# Patient Record
Sex: Male | Born: 2000 | Race: Black or African American | Hispanic: No | Marital: Single | Smoking: Never smoker
Health system: Southern US, Community
[De-identification: ages and names within clinical notes are randomized; demographics above are authoritative.]

## PROBLEM LIST (undated history)

## (undated) DIAGNOSIS — H548 Legal blindness, as defined in USA: Secondary | ICD-10-CM

## (undated) HISTORY — PX: EYE SURGERY: SHX253

---
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
## 2019-02-07 ENCOUNTER — Encounter (HOSPITAL_COMMUNITY): Payer: Self-pay

## 2019-02-07 ENCOUNTER — Ambulatory Visit (HOSPITAL_COMMUNITY)
Admission: EM | Admit: 2019-02-07 | Discharge: 2019-02-07 | Disposition: A | Discharge status: Home / Self Care | Payer: Medicaid Other | Attending: Family Medicine | Admitting: Family Medicine

## 2019-02-07 ENCOUNTER — Ambulatory Visit (INDEPENDENT_AMBULATORY_CARE_PROVIDER_SITE_OTHER): Payer: Medicaid Other

## 2019-02-07 ENCOUNTER — Other Ambulatory Visit: Payer: Self-pay

## 2019-02-07 DIAGNOSIS — M79674 Pain in right toe(s): Secondary | ICD-10-CM | POA: Diagnosis not present

## 2019-02-07 DIAGNOSIS — M79642 Pain in left hand: Secondary | ICD-10-CM

## 2019-02-07 DIAGNOSIS — M545 Low back pain: Secondary | ICD-10-CM

## 2019-02-07 DIAGNOSIS — W2211XA Striking against or struck by driver side automobile airbag, initial encounter: Secondary | ICD-10-CM

## 2019-02-07 MED ORDER — NAPROXEN 500 MG PO TABS: 500.0000 mg | ORAL_TABLET | Freq: Two times a day (BID) | ORAL | 0 refills | Status: DC

## 2019-02-07 MED ORDER — CYCLOBENZAPRINE HCL 10 MG PO TABS: 5.0000 mg | ORAL_TABLET | Freq: Every day | ORAL | 0 refills | Status: DC
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
## 2019-06-08 ENCOUNTER — Encounter (HOSPITAL_COMMUNITY): Payer: Self-pay

## 2019-06-08 ENCOUNTER — Ambulatory Visit (HOSPITAL_COMMUNITY)
Admission: EM | Admit: 2019-06-08 | Discharge: 2019-06-08 | Disposition: A | Discharge status: Home / Self Care | Payer: Medicaid Other | Attending: Family Medicine | Admitting: Family Medicine

## 2019-06-08 ENCOUNTER — Other Ambulatory Visit: Payer: Self-pay

## 2019-06-08 DIAGNOSIS — Z113 Encounter for screening for infections with a predominantly sexual mode of transmission: Secondary | ICD-10-CM | POA: Diagnosis present

## 2019-06-08 DIAGNOSIS — M545 Low back pain, unspecified: Secondary | ICD-10-CM

## 2019-06-08 MED ORDER — DICLOFENAC SODIUM 75 MG PO TBEC: 75.0000 mg | DELAYED_RELEASE_TABLET | Freq: Two times a day (BID) | ORAL | 0 refills | Status: DC

## 2019-06-08 NOTE — ED Notes (Signed)
At the completion of triage, patient requested to this RN that he also receive STD testing. Pt refuses HIV testing.

## 2019-06-08 NOTE — Discharge Instructions (Addendum)
We have sent testing for sexually transmitted infections. We will notify you of any positive results once they are received. If required, we will prescribe any medications you might need.  Please refrain from all sexual activity for at least the next seven days.  

## 2019-06-08 NOTE — ED Triage Notes (Signed)
Patient presents to Urgent Care with complaints of rectal pain since this morning. Patient reports no known injury, denies constipation or difficulty passing BMs. Pt appears uncomfortable discussing the matter with this RN.

## 2019-06-10 NOTE — ED Provider Notes (Signed)
[facility]   [id number] 06/08/19 Arrival Time: 1756  ASSESSMENT & PLAN:  1. Acute right-sided low back pain without sciatica   2. Screening for STDs (sexually transmitted diseases)     Able to ambulate here and hemodynamically stable. No sign of skin infection. No indication for imaging of back at this time given no trauma and normal neurological exam. Discussed.  Meds ordered this encounter  Medications  . diclofenac (VOLTAREN) 75 MG EC tablet    Sig: Take 1 tablet (75 mg total) by mouth 2 (two) times daily.    Dispense:  14 tablet    Refill:  0   Encourage ROM/movement as tolerated.     Discharge Instructions     We have sent testing for sexually transmitted infections. We will notify you of any positive results once they are received. If required, we will prescribe any medications you might need.  Please refrain from all sexual activity for at least the next seven days.      Reviewed expectations re: course of current medical issues. Questions answered. Outlined signs and symptoms indicating need for more acute intervention. Patient verbalized understanding. After Visit Summary given.   SUBJECTIVE: History from: patient.  Mark Taylor is a 18 y.o. male who presents with complaint of intermittent bilateral (R>L) lower back discomfort. Onset abrupt, first noticed this morning. Injury/trama: no. History of back problems: no. Discomfort described as soreness without radiation. No specific rectal pain reported even though he mentioned this during triage. "Kinf of at the top of my butt". Pain is unchanged with prolonged walking/standing, unchanged with movements involving back, and unchanged with rest. Progressive LE weakness or saddle anesthesia: none. Extremity sensation changes or weakness: none. Ambulatory without difficulty. Normal bowel/bladder habits: yes; without urinary retention. No associated abdominal pain/n/v. Self treatment: has tried nothing  for pain relief.  Reports no chronic steroid use, fevers, IV drug use, or recent back surgeries or procedures.  Later in visit requests STI screening. No penile discharge or skin lesions. "Just wanted to get checked". Sexually active with one male partner; without regular condom use.  ROS: As per HPI. All other systems negative.   OBJECTIVE:  Vitals:   06/08/19 1804  BP: 115/73  Pulse: 69  Resp: 14  Temp: 98.8 F (37.1 C)  TempSrc: Oral  SpO2: 100%    General appearance: alert; no distress Neck: supple with FROM; without midline tenderness CV: RRR Lungs: unlabored respirations; symmetrical air entry Abdomen: soft, non-tender; non-distended Back: mild tenderness that is poorly localized reported at lower back and upper midline buttock; no sign of pilonidal cyst; overlying skin is normal; FROM at waist; bruising: none; without lumbar midline tenderness GU/Rectal: declines Extremities: no edema; symmetrical with no gross deformities; normal ROM of bilateral lower extremities Skin: warm and dry Neurologic: normal gait; normal reflexes of RLE and LLE; normal sensation of RLE and LLE; normal strength of RLE and LLE Psychological: alert and cooperative; normal mood and affect  Labs: No results found for this or any previous visit. Labs Reviewed  CYTOLOGY, (ORAL, ANAL, URETHRAL) ANCILLARY ONLY     No Known Allergies  PMH: "Healthy."  Social History   Socioeconomic History  . Marital status: Single    Spouse name: Not on file  . Number of children: Not on file  . Years of education: Not on file  . Highest education level: Not on file  Occupational History  . Not on file  Social Needs  . Financial resource strain: Not  on file  . Food insecurity    Worry: Not on file    Inability: Not on file  . Transportation needs    Medical: Not on file    Non-medical: Not on file  Tobacco Use  . Smoking status: Never Smoker  . Smokeless tobacco: Never Used  Substance and  Sexual Activity  . Alcohol use: Not Currently  . Drug use: Yes    Types: Marijuana    Comment: socially  . Sexual activity: Not on file  Lifestyle  . Physical activity    Days per week: Not on file    Minutes per session: Not on file  . Stress: Not on file  Relationships  . Social Herbalist on phone: Not on file    Gets together: Not on file    Attends religious service: Not on file    Active member of club or organization: Not on file    Attends meetings of clubs or organizations: Not on file    Relationship status: Not on file  . Intimate partner violence    Fear of current or ex partner: Not on file    Emotionally abused: Not on file    Physically abused: Not on file    Forced sexual activity: Not on file  Other Topics Concern  . Not on file  Social History Narrative  . Not on file   Family History  Problem Relation Age of Onset  . Healthy Mother   . Healthy Father    History reviewed. No pertinent surgical history.   Vanessa Kick, MD 06/10/19 [phone]

## 2019-06-11 LAB — CYTOLOGY, (ORAL, ANAL, URETHRAL) ANCILLARY ONLY
Chlamydia: NEGATIVE
Neisseria Gonorrhea: NEGATIVE

## 2019-09-02 ENCOUNTER — Encounter (HOSPITAL_COMMUNITY): Payer: Self-pay

## 2019-09-02 ENCOUNTER — Other Ambulatory Visit: Payer: Self-pay

## 2019-09-02 ENCOUNTER — Ambulatory Visit (HOSPITAL_COMMUNITY)
Admission: EM | Admit: 2019-09-02 | Discharge: 2019-09-02 | Disposition: A | Discharge status: Home / Self Care | Payer: Medicaid Other | Attending: Family Medicine | Admitting: Family Medicine

## 2019-09-02 DIAGNOSIS — S39012A Strain of muscle, fascia and tendon of lower back, initial encounter: Secondary | ICD-10-CM | POA: Diagnosis not present

## 2019-09-02 DIAGNOSIS — S161XXA Strain of muscle, fascia and tendon at neck level, initial encounter: Secondary | ICD-10-CM | POA: Diagnosis not present

## 2019-09-02 MED ORDER — CYCLOBENZAPRINE HCL 10 MG PO TABS: ORAL_TABLET | ORAL | 0 refills | Status: DC

## 2019-09-02 MED ORDER — DICLOFENAC SODIUM 75 MG PO TBEC: 75.0000 mg | DELAYED_RELEASE_TABLET | Freq: Two times a day (BID) | ORAL | 0 refills | Status: DC
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
## 2019-09-13 ENCOUNTER — Encounter (HOSPITAL_COMMUNITY): Payer: Self-pay

## 2019-09-13 ENCOUNTER — Ambulatory Visit (HOSPITAL_COMMUNITY)
Admission: EM | Admit: 2019-09-13 | Discharge: 2019-09-13 | Disposition: A | Discharge status: Home / Self Care | Payer: Medicaid Other | Attending: Family Medicine | Admitting: Family Medicine

## 2019-09-13 ENCOUNTER — Ambulatory Visit (INDEPENDENT_AMBULATORY_CARE_PROVIDER_SITE_OTHER): Payer: Medicaid Other

## 2019-09-13 ENCOUNTER — Other Ambulatory Visit: Payer: Self-pay

## 2019-09-13 DIAGNOSIS — S46911A Strain of unspecified muscle, fascia and tendon at shoulder and upper arm level, right arm, initial encounter: Secondary | ICD-10-CM | POA: Diagnosis not present

## 2019-09-13 MED ORDER — PREDNISONE 50 MG PO TABS: ORAL_TABLET | ORAL | 0 refills | Status: DC

## 2019-09-13 NOTE — Discharge Instructions (Signed)
EXAM: RIGHT SHOULDER - 2+ VIEW   COMPARISON:  None.   FINDINGS: The joint spaces are maintained. No acute bony findings or bone lesion. No abnormal soft tissue calcifications. The visualized lung is clear and the visualized ribs are intact.   IMPRESSION: No fracture or dislocation

## 2019-09-13 NOTE — ED Triage Notes (Signed)
Pt. States he is having right arm pain & states he may have slept on it wrong.

## 2019-09-13 NOTE — ED Provider Notes (Signed)
[facility]    CSN: [id number] Arrival date & time: 09/13/19  4270      History   Chief Complaint Chief Complaint  Patient presents with  . Arm Pain    HPI Mark Taylor is a 18 y.o. male.   Established [facility] patient with arm pain.  Pt. States he is having right arm pain & states he may have slept on it wrong.   He feels anterior soreness and popping internally with movement.  Able to raise arm over head, though with difficulty.  No neck pain.  No fever.  No h/o trauma.     History reviewed. No pertinent past medical history.  There are no active problems to display for this patient.   History reviewed. No pertinent surgical history.     Home Medications    Prior to Admission medications   Medication Sig Start Date End Date Taking? Authorizing Provider  cyclobenzaprine (FLEXERIL) 10 MG tablet Take 1 tablet by mouth 3 times daily as needed for muscle spasm. Warning: May cause drowsiness. 09/02/19   Mardella Layman, MD  diclofenac (VOLTAREN) 75 MG EC tablet Take 1 tablet (75 mg total) by mouth 2 (two) times daily. 09/02/19   Mardella Layman, MD  predniSONE (DELTASONE) 50 MG tablet One daily with food 09/13/19   Elvina Sidle, MD    Family History Family History  Problem Relation Age of Onset  . Healthy Mother   . Healthy Father     Social History Social History   Tobacco Use  . Smoking status: Never Smoker  . Smokeless tobacco: Never Used  Substance Use Topics  . Alcohol use: Not Currently  . Drug use: Yes    Types: Marijuana    Comment: socially     Allergies   Patient has no known allergies.   Review of Systems Review of Systems  All other systems reviewed and are negative.    Physical Exam Triage Vital Signs ED Triage Vitals [09/13/19 1001]  Enc Vitals Group     BP      Pulse      Resp      Temp      Temp src      SpO2      Weight 150 lb (68 kg)     Height      Head Circumference      Peak Flow      Pain Score 9     Pain Loc      Pain Edu?      Excl. in GC?    No data found.  Updated Vital Signs BP 113/80 (BP Location: Left Arm)   Pulse 68   Temp 98.4 F (36.9 C) (Oral)   Resp 17   Wt 68 kg   SpO2 97%    Physical Exam Vitals signs and nursing note reviewed.  Constitutional:      Appearance: Normal appearance. He is normal weight.  HENT:     Head: Normocephalic.     Nose: Nose normal.     Mouth/Throat:     Pharynx: Oropharynx is clear.  Eyes:     Conjunctiva/sclera: Conjunctivae normal.  Neck:     Musculoskeletal: Normal range of motion and neck supple.  Cardiovascular:     Rate and Rhythm: Normal rate.  Pulmonary:     Effort: Pulmonary effort is normal.     Breath sounds: Normal breath sounds.  Musculoskeletal: Normal range of motion.  General: Tenderness present. No swelling, deformity or signs of injury.     Comments: Able to slowly abduct right shoulder with some discomfort.  Mild anterior joint line tenderness.  Skin:    General: Skin is warm.  Neurological:     General: No focal deficit present.     Mental Status: He is alert.  Psychiatric:        Mood and Affect: Mood normal.      UC Treatments / Results  Labs (all labs ordered are listed, but only abnormal results are displayed) Labs Reviewed - No data to display  EKG   Radiology Dg Shoulder Right  Result Date: 09/13/2019 CLINICAL DATA:  Pain and popping and right shoulder. EXAM: RIGHT SHOULDER - 2+ VIEW COMPARISON:  None. FINDINGS: The joint spaces are maintained. No acute bony findings or bone lesion. No abnormal soft tissue calcifications. The visualized lung is clear and the visualized ribs are intact. IMPRESSION: No fracture or dislocation Electronically Signed   By: Marijo Sanes M.D.   On: 09/13/2019 10:23    Procedures Procedures (including critical care time)  Medications Ordered in UC Medications - No data to display  Initial Impression / Assessment and Plan / UC Course  I have  reviewed the triage vital signs and the nursing notes.  Pertinent labs & imaging results that were available during my care of the patient were reviewed by me and considered in my medical decision making (see chart for details).    Final Clinical Impressions(s) / UC Diagnoses   Final diagnoses:  Strain of right shoulder, initial encounter     Discharge Instructions     EXAM: RIGHT SHOULDER - 2+ VIEW   COMPARISON:  None.   FINDINGS: The joint spaces are maintained. No acute bony findings or bone lesion. No abnormal soft tissue calcifications. The visualized lung is clear and the visualized ribs are intact.   IMPRESSION: No fracture or dislocation      ED Prescriptions    Medication Sig Dispense Auth. Provider   predniSONE (DELTASONE) 50 MG tablet One daily with food 3 tablet Robyn Haber, MD     I have reviewed the PDMP during this encounter.   Robyn Haber, MD 09/13/19 1044

## 2020-02-11 ENCOUNTER — Other Ambulatory Visit: Payer: Self-pay

## 2020-02-11 ENCOUNTER — Encounter (HOSPITAL_COMMUNITY): Payer: Self-pay

## 2020-02-11 ENCOUNTER — Ambulatory Visit (HOSPITAL_COMMUNITY)
Admission: EM | Admit: 2020-02-11 | Discharge: 2020-02-11 | Disposition: A | Discharge status: Home / Self Care | Payer: Medicaid Other | Attending: Emergency Medicine | Admitting: Emergency Medicine

## 2020-02-11 DIAGNOSIS — M545 Low back pain, unspecified: Secondary | ICD-10-CM

## 2020-02-11 DIAGNOSIS — M542 Cervicalgia: Secondary | ICD-10-CM | POA: Diagnosis not present

## 2020-02-11 HISTORY — DX: Legal blindness, as defined in USA: H54.8

## 2020-02-11 MED ORDER — DICLOFENAC SODIUM 75 MG PO TBEC: 75.0000 mg | DELAYED_RELEASE_TABLET | Freq: Two times a day (BID) | ORAL | 0 refills | Status: DC

## 2020-02-11 MED ORDER — CYCLOBENZAPRINE HCL 10 MG PO TABS: 10.0000 mg | ORAL_TABLET | Freq: Two times a day (BID) | ORAL | 0 refills | Status: DC | PRN
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
## 2020-02-11 NOTE — Discharge Instructions (Addendum)
Rest, ice and heat as needed Ensure adequate range of motion as tolerated. Injuries all appear to be muscular in nature at this time Prescribed Voltaren c as needed for inflammation and pain relief.  DO NOT TAKE WITH OTHER antiinflammatories, as this may cause GI upset and/or bleed Prescribed flexeril as needed at bedtime for muscle spasm.  Do not drive or operate heavy machinery while taking this medication Expect some increased pain in the next 1-3 days.  It may take 3-4 weeks for complete resolution of symptoms Will f/u with his doctor or here if not seeing significant improvement within one week. Return here or go to ER if you have any new or worsening symptoms such as numbness/tingling of the inner thighs, loss of bladder or bowel control, headache/blurry vision, nausea/vomiting, confusion/altered mental status, dizziness, weakness, passing out, imbalance, etc..Marland Kitchen

## 2020-10-20 ENCOUNTER — Encounter (HOSPITAL_COMMUNITY): Payer: Self-pay

## 2020-10-20 ENCOUNTER — Ambulatory Visit (HOSPITAL_COMMUNITY)
Admission: EM | Admit: 2020-10-20 | Discharge: 2020-10-20 | Disposition: A | Discharge status: Home / Self Care | Payer: Medicaid Other | Attending: Family Medicine | Admitting: Family Medicine

## 2020-10-20 ENCOUNTER — Other Ambulatory Visit: Payer: Self-pay

## 2020-10-20 DIAGNOSIS — H60391 Other infective otitis externa, right ear: Secondary | ICD-10-CM | POA: Diagnosis not present

## 2020-10-20 MED ORDER — CIPROFLOXACIN-DEXAMETHASONE 0.3-0.1 % OT SUSP: 4.0000 [drp] | Freq: Two times a day (BID) | OTIC | 0 refills | Status: DC

## 2020-10-20 NOTE — ED Triage Notes (Signed)
Pt c/o right ear pain X 3 days. Pt states his ear feels numb. Pt states he has not taken OTC medicine. He states when he pulls his ear he feels relief.

## 2020-10-20 NOTE — ED Provider Notes (Signed)
[facility]    CSN: [id number] Arrival date & time: 10/20/20  6644      History   Chief Complaint Chief Complaint  Patient presents with  . Otalgia    HPI Mark Taylor is a 20 y.o. male.   Here today with 3 day history of right ear canal pain, tingling and intermittent decreased hearing. Denies fever, chills, obvious drainage from hear, headache, sore throat, congestion. Not trying anything OTC for sxs. States he's never had this issue in the past.      Past Medical History:  Diagnosis Date  . Legally blind in left eye, as defined in [place]     There are no problems to display for this patient.   Past Surgical History:  Procedure Laterality Date  . EYE SURGERY Left        Home Medications    Prior to Admission medications   Medication Sig Start Date End Date Taking? Authorizing Provider  ciprofloxacin-dexamethasone (CIPRODEX) OTIC suspension Place 4 drops into the right ear 2 (two) times daily. 10/20/20  Yes Particia Nearing, PA-C  cyclobenzaprine (FLEXERIL) 10 MG tablet Take 1 tablet (10 mg total) by mouth 2 (two) times daily as needed for muscle spasms. 02/11/20   Avegno, Zachery Dakins, FNP  diclofenac (VOLTAREN) 75 MG EC tablet Take 1 tablet (75 mg total) by mouth 2 (two) times daily. 02/11/20   Avegno, Zachery Dakins, FNP  predniSONE (DELTASONE) 50 MG tablet One daily with food 09/13/19   Elvina Sidle, MD    Family History Family History  Problem Relation Age of Onset  . Healthy Mother   . Healthy Father     Social History Social History   Tobacco Use  . Smoking status: Never Smoker  . Smokeless tobacco: Never Used  Vaping Use  . Vaping Use: Never used  Substance Use Topics  . Alcohol use: Not Currently  . Drug use: Yes    Types: Marijuana    Comment: socially     Allergies   Patient has no known allergies.   Review of Systems Review of Systems PER HPI    Physical Exam Triage Vital Signs ED Triage Vitals  Enc Vitals  Group     BP 10/20/20 0935 120/80     Pulse Rate 10/20/20 0935 68     Resp 10/20/20 0935 18     Temp 10/20/20 0935 98.3 F (36.8 C)     Temp Source 10/20/20 0935 Oral     SpO2 10/20/20 0935 99 %     Weight --      Height --      Head Circumference --      Peak Flow --      Pain Score 10/20/20 0934 9     Pain Loc --      Pain Edu? --      Excl. in GC? --    No data found.  Updated Vital Signs BP 120/80 (BP Location: Right Arm)   Pulse 68   Temp 98.3 F (36.8 C) (Oral)   Resp 18   SpO2 99%   Visual Acuity Right Eye Distance:   Left Eye Distance:   Bilateral Distance:    Right Eye Near:   Left Eye Near:    Bilateral Near:     Physical Exam Vitals and nursing note reviewed.  Constitutional:      Appearance: Normal appearance.  HENT:     Head: Atraumatic.     Left Ear: Tympanic  membrane, ear canal and external ear normal.     Ears:     Comments: Right EAC erythematous, significantly edematous, ttp Right TM benign appearing Eyes:     Extraocular Movements: Extraocular movements intact.     Conjunctiva/sclera: Conjunctivae normal.  Cardiovascular:     Rate and Rhythm: Normal rate and regular rhythm.  Pulmonary:     Effort: Pulmonary effort is normal.     Breath sounds: Normal breath sounds.  Musculoskeletal:        General: Normal range of motion.     Cervical back: Normal range of motion and neck supple.  Skin:    General: Skin is warm and dry.  Neurological:     General: No focal deficit present.     Mental Status: He is oriented to person, place, and time.  Psychiatric:        Mood and Affect: Mood normal.        Thought Content: Thought content normal.        Judgment: Judgment normal.      UC Treatments / Results  Labs (all labs ordered are listed, but only abnormal results are displayed) Labs Reviewed - No data to display  EKG   Radiology No results found.  Procedures Procedures (including critical care time)  Medications Ordered in  UC Medications - No data to display  Initial Impression / Assessment and Plan / UC Course  I have reviewed the triage vital signs and the nursing notes.  Pertinent labs & imaging results that were available during my care of the patient were reviewed by me and considered in my medical decision making (see chart for details).     Treat with ciprodex drops, mineral oil, OTC pain relievers prn. Return precautions reviewed.   Final Clinical Impressions(s) / UC Diagnoses   Final diagnoses:  Other infective acute otitis externa of right ear   Discharge Instructions   None    ED Prescriptions    Medication Sig Dispense Auth. Provider   ciprofloxacin-dexamethasone (CIPRODEX) OTIC suspension Place 4 drops into the right ear 2 (two) times daily. 7.5 mL Particia Nearing, [place]     PDMP not reviewed this encounter.   Particia Nearing, [place] 10/20/20 1100

## 2020-12-08 ENCOUNTER — Ambulatory Visit: Payer: Medicaid Other | Admitting: Student in an Organized Health Care Education/Training Program

## 2021-11-21 ENCOUNTER — Encounter (HOSPITAL_COMMUNITY): Payer: Self-pay | Admitting: Emergency Medicine

## 2021-11-21 ENCOUNTER — Ambulatory Visit (HOSPITAL_COMMUNITY)
Admission: EM | Admit: 2021-11-21 | Discharge: 2021-11-21 | Disposition: A | Discharge status: Home / Self Care | Payer: Medicaid Other | Attending: Emergency Medicine | Admitting: Emergency Medicine

## 2021-11-21 ENCOUNTER — Other Ambulatory Visit: Payer: Self-pay

## 2021-11-21 DIAGNOSIS — H60331 Swimmer's ear, right ear: Secondary | ICD-10-CM | POA: Diagnosis not present

## 2021-11-21 DIAGNOSIS — J039 Acute tonsillitis, unspecified: Secondary | ICD-10-CM | POA: Diagnosis not present

## 2021-11-21 MED ORDER — CEFDINIR 300 MG PO CAPS: 300.0000 mg | ORAL_CAPSULE | Freq: Two times a day (BID) | ORAL | 0 refills | Status: AC

## 2021-11-21 MED ORDER — CIPROFLOXACIN-DEXAMETHASONE 0.3-0.1 % OT SUSP: 4.0000 [drp] | Freq: Two times a day (BID) | OTIC | 0 refills | Status: AC

## 2021-11-21 NOTE — ED Triage Notes (Signed)
Pt reports right ear pain x 1 week

## 2021-11-21 NOTE — Discharge Instructions (Addendum)
Please discontinue Cortisporin drops.  I believe that they had made the infection in your right ear worse because the antibiotic and Cortisporin has a very narrow spectrum of bacterial coverage.  I have renewed your prescription for Ciprodex otic drops, please place 4 drops in your right ear twice daily for the next full 7 days.  Because you have enlarged tonsils and because of the duration of the infection in your right ear, I also recommend that you begin taking cefdinir capsules, 1 capsule twice daily for the next 10 days.  It is very important that you finish all antibiotics as prescribed, failure to do so can result in worsening infection.  Thank you for visiting urgent care today.  Please let us know if you have not had complete resolution of your symptoms after the full 10 days of treatment.

## 2021-11-21 NOTE — ED Provider Notes (Signed)
[employer]    CSN: [id number] Arrival date & time: 11/21/21  1054    HISTORY   Chief Complaint  Patient presents with   Otalgia   HPI Mark Taylor is a 21 y.o. male. Patient complains of continued right ear pain for the past week.  Patient has been seen twice recently for the same issue, first on January 11 and again on February 6.  January 11, he was provided with Ciprodex drops and on 3 6 he was provided with Cortisporin.  Patient reports no relief of his symptoms.  Patient is afebrile on arrival today with normal vital signs.  The history is provided by the patient.  Past Medical History:  Diagnosis Date   Legally blind in left eye, as defined in [place]    There are no problems to display for this patient.  Past Surgical History:  Procedure Laterality Date   EYE SURGERY Left     Home Medications    Prior to Admission medications   Medication Sig Start Date End Date Taking? Authorizing Provider  ciprofloxacin-dexamethasone (CIPRODEX) OTIC suspension Place 4 drops into the right ear 2 (two) times daily. 10/20/20   Volney American, PA-C  cyclobenzaprine (FLEXERIL) 10 MG tablet Take 1 tablet (10 mg total) by mouth 2 (two) times daily as needed for muscle spasms. 02/11/20   Avegno, Darrelyn Hillock, FNP  diclofenac (VOLTAREN) 75 MG EC tablet Take 1 tablet (75 mg total) by mouth 2 (two) times daily. 02/11/20   Avegno, Darrelyn Hillock, FNP  predniSONE (DELTASONE) 50 MG tablet One daily with food 09/13/19   Robyn Haber, MD   Family History Family History  Problem Relation Age of Onset   Healthy Mother    Healthy Father    Social History Social History   Tobacco Use   Smoking status: Never   Smokeless tobacco: Never  Vaping Use   Vaping Use: Never used  Substance Use Topics   Alcohol use: Not Currently   Drug use: Yes    Types: Marijuana    Comment: socially   Allergies   Patient has no known allergies.  Review of Systems Review of Systems Pertinent  findings noted in history of present illness.   Physical Exam Triage Vital Signs ED Triage Vitals  Enc Vitals Group     BP 08/06/21 0827 (!) 147/82     Pulse Rate 08/06/21 0827 72     Resp 08/06/21 0827 18     Temp 08/06/21 0827 98.3 F (36.8 C)     Temp Source 08/06/21 0827 Oral     SpO2 08/06/21 0827 98 %     Weight --      Height --      Head Circumference --      Peak Flow --      Pain Score 08/06/21 0826 5     Pain Loc --      Pain Edu? --      Excl. in [place]? --   No data found.  Updated Vital Signs BP 123/88 (BP Location: Right Arm)    Pulse 66    Temp 98 F (36.7 C) (Oral)    Resp 16    Ht 5\' 7"  (1.702 m)    Wt 179 lb 14.3 oz (81.6 kg)    SpO2 97%    BMI 28.18 kg/m   Physical Exam Vitals and nursing note reviewed.  Constitutional:      General: He is not in acute distress.  Appearance: Normal appearance. He is not ill-appearing.  HENT:     Head: Normocephalic and atraumatic.     Salivary Glands: Right salivary gland is not diffusely enlarged or tender. Left salivary gland is not diffusely enlarged or tender.     Right Ear: External ear normal. No drainage. A middle ear effusion (Purulent) is present. There is no impacted cerumen.     Left Ear: External ear normal. No drainage.  No middle ear effusion. There is no impacted cerumen. Tympanic membrane is erythematous. Tympanic membrane is not bulging.     Ears:     Comments: Left EAC with diffuse erythema    Nose: Mucosal edema present. No nasal deformity, septal deviation, congestion or rhinorrhea.     Right Turbinates: Enlarged. Not swollen or pale.     Left Turbinates: Enlarged. Not swollen or pale.     Right Sinus: No maxillary sinus tenderness or frontal sinus tenderness.     Left Sinus: No maxillary sinus tenderness or frontal sinus tenderness.     Mouth/Throat:     Lips: Pink. No lesions.     Mouth: Mucous membranes are moist. No oral lesions.     Pharynx: Oropharynx is clear. Uvula midline. Posterior  oropharyngeal erythema present. No pharyngeal swelling, oropharyngeal exudate or uvula swelling.     Tonsils: No tonsillar exudate. 2+ on the right. 3+ on the left.  Eyes:     General: Lids are normal.        Right eye: No discharge.        Left eye: No discharge.     Extraocular Movements: Extraocular movements intact.     Conjunctiva/sclera: Conjunctivae normal.     Right eye: Right conjunctiva is not injected.     Left eye: Left conjunctiva is not injected.  Neck:     Trachea: Trachea and phonation normal.  Cardiovascular:     Rate and Rhythm: Normal rate and regular rhythm.     Pulses: Normal pulses.     Heart sounds: Normal heart sounds. No murmur heard.   No friction rub. No gallop.  Pulmonary:     Effort: Pulmonary effort is normal. No accessory muscle usage, prolonged expiration or respiratory distress.     Breath sounds: Normal breath sounds. No stridor, decreased air movement or transmitted upper airway sounds. No decreased breath sounds, wheezing, rhonchi or rales.  Chest:     Chest wall: No tenderness.  Musculoskeletal:        General: Normal range of motion.     Cervical back: Normal range of motion and neck supple. Normal range of motion.  Lymphadenopathy:     Cervical: Cervical adenopathy present.     Right cervical: Superficial cervical adenopathy and posterior cervical adenopathy present.     Left cervical: Superficial cervical adenopathy and posterior cervical adenopathy present.  Skin:    General: Skin is warm and dry.     Findings: No erythema or rash.  Neurological:     General: No focal deficit present.     Mental Status: He is alert and oriented to person, place, and time.  Psychiatric:        Mood and Affect: Mood normal.        Behavior: Behavior normal.    Visual Acuity Right Eye Distance:   Left Eye Distance:   Bilateral Distance:    Right Eye Near:   Left Eye Near:    Bilateral Near:     UC Couse / Diagnostics / Procedures:  EKG  Radiology No results found.  Procedures Procedures (including critical care time)  UC Diagnoses / Final Clinical Impressions(s)   I have reviewed the triage vital signs and the nursing notes.  Pertinent labs & imaging results that were available during my care of the patient were reviewed by me and considered in my medical decision making (see chart for details).   Final diagnoses:  Acute swimmer's ear of right side  Acute tonsillitis, unspecified etiology   Acute otitis externa in the right ear, incompletely treated with narrow spectrum Cortisporin, and tonsillitis will be addressed with Ciprodex drops and a 2-day course of cefdinir.  Patient educated regarding antibiotic stewardship.  Return precautions advised.  ED Prescriptions     Medication Sig Dispense Auth. Provider   ciprofloxacin-dexamethasone (CIPRODEX) OTIC suspension Place 4 drops into the right ear 2 (two) times daily for 7 days. 7.5 mL Lynden Oxford Scales, PA-C   cefdinir (OMNICEF) 300 MG capsule Take 1 capsule (300 mg total) by mouth 2 (two) times daily for 10 days. 20 capsule Lynden Oxford Scales, PA-C      PDMP not reviewed this encounter.  Pending results:  Labs Reviewed - No data to display  Medications Ordered in UC: Medications - No data to display  Disposition Upon Discharge:  Condition: stable for discharge home Home: take medications as prescribed; routine discharge instructions as discussed; follow up as advised.  Patient presented with an acute illness with associated systemic symptoms and significant discomfort requiring urgent management. In my opinion, this is a condition that a prudent lay person (someone who possesses an average knowledge of health and medicine) may potentially expect to result in complications if not addressed urgently such as respiratory distress, impairment of bodily function or dysfunction of bodily organs.   Routine symptom specific, illness specific and/or  disease specific instructions were discussed with the patient and/or caregiver at length.   As such, the patient has been evaluated and assessed, work-up was performed and treatment was provided in alignment with urgent care protocols and evidence based medicine.  Patient/parent/caregiver has been advised that the patient may require follow up for further testing and treatment if the symptoms continue in spite of treatment, as clinically indicated and appropriate.  If the patient was tested for COVID-19, Influenza and/or RSV, then the patient/parent/guardian was advised to isolate at home pending the results of his/her diagnostic coronavirus test and potentially longer if theyre positive. I have also advised pt that if his/her COVID-19 test returns positive, it's recommended to self-isolate for at least 10 days after symptoms first appeared AND until fever-free for 24 hours without fever reducer AND other symptoms have improved or resolved. Discussed self-isolation recommendations as well as instructions for household member/close contacts as per the [facility] [place] DHHS, and also gave patient the [place] packet with this information.  Patient/parent/caregiver has been advised to return to the [facility] or PCP in 3-5 days if no better; to PCP or the Emergency Department if new signs and symptoms develop, or if the current signs or symptoms continue to change or worsen for further workup, evaluation and treatment as clinically indicated and appropriate  The patient will follow up with their current PCP if and as advised. If the patient does not currently have a PCP we will assist them in obtaining one.   The patient may need specialty follow up if the symptoms continue, in spite of conservative treatment and management, for further workup, evaluation, consultation and treatment as clinically indicated and appropriate.  Patient/parent/caregiver verbalized understanding and agreement of plan as discussed.  All  questions were addressed during visit.  Please see discharge instructions below for further details of plan.  Discharge Instructions:   Discharge Instructions      Please discontinue Cortisporin drops.  I believe that they had made the infection in your right ear worse because the antibiotic and Cortisporin has a very narrow spectrum of bacterial coverage.  I have renewed your prescription for Ciprodex otic drops, please place 4 drops in your right ear twice daily for the next full 7 days.  Because you have enlarged tonsils and because of the duration of the infection in your right ear, I also recommend that you begin taking cefdinir capsules, 1 capsule twice daily for the next 10 days.  It is very important that you finish all antibiotics as prescribed, failure to do so can result in worsening infection.  Thank you for visiting urgent care today.  Please let us know if you have not had complete resolution of your symptoms after the full 10 days of treatment.    This office note has been dictated using Museum/gallery curator.  Unfortunately, and despite my best efforts, this method of dictation can sometimes lead to occasional typographical or grammatical errors.  I apologize in advance if this occurs.     Lynden Oxford Scales, PA-C 11/21/21 1318

## 2022-05-15 ENCOUNTER — Encounter (HOSPITAL_BASED_OUTPATIENT_CLINIC_OR_DEPARTMENT_OTHER): Payer: Self-pay

## 2022-05-15 ENCOUNTER — Other Ambulatory Visit: Payer: Self-pay

## 2022-05-15 DIAGNOSIS — L0231 Cutaneous abscess of buttock: Secondary | ICD-10-CM | POA: Diagnosis present

## 2022-05-15 NOTE — ED Triage Notes (Signed)
POV, pt c/o abscess on right buttocks that started 3 days ago. Pt alert and oriented x 4, ambulatory to triage, unable to sit in chair.

## 2022-05-16 ENCOUNTER — Emergency Department (HOSPITAL_BASED_OUTPATIENT_CLINIC_OR_DEPARTMENT_OTHER)
Admission: EM | Admit: 2022-05-16 | Discharge: 2022-05-16 | Disposition: A | Discharge status: Home / Self Care | Payer: Medicaid Other | Attending: Emergency Medicine | Admitting: Emergency Medicine

## 2022-05-16 DIAGNOSIS — L0231 Cutaneous abscess of buttock: Secondary | ICD-10-CM

## 2022-05-16 MED ORDER — LIDOCAINE-EPINEPHRINE-TETRACAINE (LET) TOPICAL GEL
3.0000 mL | Freq: Once | TOPICAL | Status: AC
  Administered 2022-05-16: 3 mL via TOPICAL
  Filled 2022-05-16: qty 3

## 2022-05-16 NOTE — ED Notes (Signed)
Pt awake and alert lying on L lateral side awaiting provider eval - pt already has removed bottoms.  Denies fevers since symptom onset.  This nurse will accompany provider when provider to bedside to examine R buttock .

## 2022-05-16 NOTE — ED Notes (Signed)
Pt agreeable with d/c plan as discussed by provider- this nurse has verbally reinforced d/c instructions and provided pt with written copy- pt acknowledges verbal understanding and denies any additional questions, concerns, needs- pt ambulatory independently at d/c escorted by signficant other - gait steady; vitals stable; no acute distress noted.

## 2022-05-16 NOTE — ED Notes (Addendum)
Late entry -- This nurse called to room for assistance; upon entering room pt requesting gauze that had been applied after L.E.T. administered be changed-- observed that abscess is now draining significant amount of malodorous reddish-brown drainage; excess drainage cleansed with 4x4 gauze and fresh gauze placed between gluteal folds for any additional drainage.  Dr. Eudelia Bunch has been notified that abscess now draining more

## 2022-05-16 NOTE — ED Notes (Signed)
Late entry --Enlarged external abcess to R inner gluteal fold noted when this nurse to bedside with provider Dr. Eudelia Bunch -- TTP; scant amount of serous drainage

## 2022-05-16 NOTE — ED Provider Notes (Signed)
[facility] EMERGENCY DEPT Provider Note  CSN: [id number] Arrival date & time: 05/15/22 2233  Chief Complaint(s) Abscess  HPI Mark Taylor is a 21 y.o. male     Abscess Location:  Pelvis Pelvic abscess location:  R buttock Size:  3cm Abscess quality: fluctuance and painful   Red streaking: no   Duration:  3 days Progression:  Worsening Pain details:    Quality:  Throbbing   Severity:  Moderate   Timing:  Constant   Progression:  Worsening Context: not diabetes and not immunosuppression   Relieved by:  Nothing Exacerbated by: palpation. Associated symptoms: no fever, no nausea and no vomiting     Past Medical History Past Medical History:  Diagnosis Date   Legally blind in left eye, as defined in [place]    There are no problems to display for this patient.  Home Medication(s) Prior to Admission medications   Medication Sig Start Date End Date Taking? Authorizing Provider  ciprofloxacin-dexamethasone (CIPRODEX) OTIC suspension Place 4 drops into the right ear 2 (two) times daily. 10/20/20   Particia Nearing, PA-C  cyclobenzaprine (FLEXERIL) 10 MG tablet Take 1 tablet (10 mg total) by mouth 2 (two) times daily as needed for muscle spasms. 02/11/20   Avegno, Zachery Dakins, FNP  diclofenac (VOLTAREN) 75 MG EC tablet Take 1 tablet (75 mg total) by mouth 2 (two) times daily. 02/11/20   Avegno, Zachery Dakins, FNP  predniSONE (DELTASONE) 50 MG tablet One daily with food 09/13/19   Elvina Sidle, MD                                                                                                                                    Allergies Patient has no known allergies.  Review of Systems Review of Systems  Constitutional:  Negative for fever.  Gastrointestinal:  Negative for nausea and vomiting.   As noted in HPI  Physical Exam Vital Signs  I have reviewed the triage vital signs BP (!) 157/103 (BP Location: Right Arm)   Pulse 95   Temp 98.3 F (36.8 C)  (Oral)   Resp 14   Ht 5\' 8"  (1.727 m)   Wt 90.7 kg   SpO2 100%   BMI 30.41 kg/m   Physical Exam Vitals reviewed. Exam conducted with a chaperone present.  Constitutional:      General: He is not in acute distress.    Appearance: He is well-developed. He is not diaphoretic.  HENT:     Head: Normocephalic and atraumatic.     Right Ear: External ear normal.     Left Ear: External ear normal.     Nose: Nose normal.     Mouth/Throat:     Mouth: Mucous membranes are moist.  Eyes:     General: No scleral icterus.    Conjunctiva/sclera: Conjunctivae normal.  Neck:     Trachea: Phonation normal.  Cardiovascular:  Rate and Rhythm: Normal rate and regular rhythm.  Pulmonary:     Effort: Pulmonary effort is normal. No respiratory distress.     Breath sounds: No stridor.  Abdominal:     General: There is no distension.  Genitourinary:   Musculoskeletal:        General: Normal range of motion.     Cervical back: Normal range of motion.  Neurological:     Mental Status: He is alert and oriented to person, place, and time.  Psychiatric:        Behavior: Behavior normal.     ED Results and Treatments Labs (all labs ordered are listed, but only abnormal results are displayed) Labs Reviewed - No data to display                                                                                                                       EKG  EKG Interpretation  Date/Time:    Ventricular Rate:    PR Interval:    QRS Duration:   QT Interval:    QTC Calculation:   R Axis:     Text Interpretation:         Radiology No results found.  Pertinent labs & imaging results that were available during my care of the patient were reviewed by me and considered in my medical decision making (see MDM for details).  Medications Ordered in ED Medications  lidocaine-EPINEPHrine-tetracaine (LET) topical gel (3 mLs Topical Given 05/16/22 0308)                                                                                                                                      Procedures Procedures  (including critical care time)  Medical Decision Making / ED Course    Complexity of Problem:  Patient's presenting problem/concern, DDX, and MDM listed below: Right buttock abscess Plan for I&D. LET applied. Abscess spontaneously ruptured, putting out moderate amount of purulent discharge.  Pain improved.  Patient declined additional incision and drainage. Recommended warm sitz bath    Final Clinical Impression(s) / ED Diagnoses Final diagnoses:  Abscess of buttock, right   The patient appears reasonably screened and/or stabilized for discharge and I doubt any other medical condition or other [facility] requiring further screening, evaluation, or treatment in the ED at this time. I have discussed the findings, Dx and Tx plan with the patient/family who expressed understanding  and agree(s) with the plan. Discharge instructions discussed at length. The patient/family was given strict return precautions who verbalized understanding of the instructions. No further questions at time of discharge.  Disposition: Discharge  Condition: Good  ED Discharge Orders     None        Follow Up: Andria Meuse, MD [address] [place] [address] [phone]  Call  as needed, if symptoms do not improve or  worsen           This chart was dictated using voice recognition software.  Despite best efforts to proofread,  errors can occur which can change the documentation meaning.    Nira Conn, MD 05/16/22 [phone]

## 2022-08-05 ENCOUNTER — Other Ambulatory Visit: Payer: Self-pay

## 2022-08-05 ENCOUNTER — Emergency Department (HOSPITAL_BASED_OUTPATIENT_CLINIC_OR_DEPARTMENT_OTHER)
Admission: EM | Admit: 2022-08-05 | Discharge: 2022-08-05 | Disposition: A | Discharge status: Home / Self Care | Payer: Medicaid Other | Attending: Emergency Medicine | Admitting: Emergency Medicine

## 2022-08-05 DIAGNOSIS — M79606 Pain in leg, unspecified: Secondary | ICD-10-CM | POA: Diagnosis not present

## 2022-08-05 DIAGNOSIS — L03314 Cellulitis of groin: Secondary | ICD-10-CM | POA: Diagnosis not present

## 2022-08-05 DIAGNOSIS — R1031 Right lower quadrant pain: Secondary | ICD-10-CM | POA: Diagnosis present

## 2022-08-05 DIAGNOSIS — L0390 Cellulitis, unspecified: Secondary | ICD-10-CM

## 2022-08-05 MED ORDER — NAPROXEN 250 MG PO TABS
500.0000 mg | ORAL_TABLET | Freq: Once | ORAL | Status: AC
  Administered 2022-08-05: 500 mg via ORAL
  Filled 2022-08-05: qty 2

## 2022-08-05 MED ORDER — AMOXICILLIN-POT CLAVULANATE 875-125 MG PO TABS
1.0000 | ORAL_TABLET | Freq: Once | ORAL | Status: AC
  Administered 2022-08-05: 1 via ORAL
  Filled 2022-08-05: qty 1

## 2022-08-05 MED ORDER — AMOXICILLIN-POT CLAVULANATE 875-125 MG PO TABS: 1.0000 | ORAL_TABLET | Freq: Two times a day (BID) | ORAL | 0 refills | Status: AC

## 2022-08-05 MED ORDER — NAPROXEN 500 MG PO TABS: 500.0000 mg | ORAL_TABLET | Freq: Two times a day (BID) | ORAL | 0 refills | Status: DC

## 2022-08-05 NOTE — Discharge Instructions (Signed)
You were evaluated in the Emergency Department and after careful evaluation, we did not find any emergent condition requiring admission or further testing in the hospital.  Your exam/testing today was overall reassuring.  Symptoms seem to be due to a cellulitis or infection of the skin.  Take the Augmentin antibiotic as directed.  Use the Naprosyn anti-inflammatory for pain.  Please return to the Emergency Department if you experience any worsening of your condition.  Thank you for allowing [place] to be a part of your care.

## 2022-08-05 NOTE — ED Triage Notes (Addendum)
Pt via POV states having abscess to right upper inner thigh. He states that site painful, warm to touch, and without drainage. First noticed two days ago.

## 2022-08-05 NOTE — ED Provider Notes (Signed)
[facility] Emergency Department Provider Note MRN:  [id number]  Arrival date & time: 08/05/22     Chief Complaint   Leg Pain   History of Present Illness   Mark Taylor is a 21 y.o. year-old male with no pertinent past medical history presenting to the ED with chief complaint of leg pain.  Redness and pain in the right groin region, present for a few days.  Tender to the touch.  No fever, no other complaints.  Review of Systems  A thorough review of systems was obtained and all systems are negative except as noted in the HPI and PMH.   Patient's Health History    Past Medical History:  Diagnosis Date   Legally blind in left eye, as defined in [place]     Past Surgical History:  Procedure Laterality Date   EYE SURGERY Left     Family History  Problem Relation Age of Onset   Healthy Mother    Healthy Father     Social History   Socioeconomic History   Marital status: Single    Spouse name: Not on file   Number of children: Not on file   Years of education: Not on file   Highest education level: Not on file  Occupational History   Not on file  Tobacco Use   Smoking status: Never   Smokeless tobacco: Never  Vaping Use   Vaping Use: Never used  Substance and Sexual Activity   Alcohol use: Not Currently   Drug use: Yes    Types: Marijuana    Comment: socially   Sexual activity: Not on file  Other Topics Concern   Not on file  Social History Narrative   Not on file   Social Determinants of Health   Financial Resource Strain: Not on file  Food Insecurity: Not on file  Transportation Needs: Not on file  Physical Activity: Not on file  Stress: Not on file  Social Connections: Not on file  Intimate Partner Violence: Not on file     Physical Exam   Vitals:   08/05/22 0349 08/05/22 0515  BP: (!) 145/92 (!) 151/88  Pulse: 97 86  Resp: 15 18  Temp: 98.7 F (37.1 C)   SpO2: 99% 99%    CONSTITUTIONAL: Well-appearing,  NAD NEURO/PSYCH:  Alert and oriented x 3, no focal deficits EYES:  eyes equal and reactive ENT/NECK:  no LAD, no JVD CARDIO: Regular rate, well-perfused, normal S1 and S2 PULM:  CTAB no wheezing or rhonchi GI/GU:  non-distended, non-tender MSK/SPINE:  No gross deformities, no edema SKIN: Swelling, induration, redness at the right groin/inguinal region.   *Additional and/or pertinent findings included in MDM below  Diagnostic and Interventional Summary    EKG Interpretation  Date/Time:    Ventricular Rate:    PR Interval:    QRS Duration:   QT Interval:    QTC Calculation:   R Axis:     Text Interpretation:         Labs Reviewed - No data to display  No orders to display    Medications  naproxen (NAPROSYN) tablet 500 mg (has no administration in time range)  amoxicillin-clavulanate (AUGMENTIN) 875-125 MG per tablet 1 tablet (has no administration in time range)     Procedures  /  Critical Care Ultrasound ED Soft Tissue  Date/Time: 08/05/2022 5:36 AM  Performed by: Maudie Flakes, MD Authorized by: Maudie Flakes, MD   Procedure details:  Indications: localization of abscess and evaluate for cellulitis     Transverse view:  Visualized   Longitudinal view:  Visualized   Images: archived   Location:    Location: groin     Side:  Right Findings:     cellulitis present   ED Course and Medical Decision Making  Initial Impression and Ddx On exam consistent with cellulitis, there is also concern for possible underlying abscess.  Ultrasound was used, no definitive fluid collection amenable to drainage at this time.  Patient had kind of a nodular area of swelling surrounding one of the stretch marks at the growing site and so there was initial suspicion for abscess but again, no obvious abscess on ultrasound.  We discussed strict return precautions, no indication for advanced imaging at this time, no signs of Fournier's, no signs of scrotal involvement, no systemic  symptoms.  Appropriate for discharge with trial of antibiotics.  Past medical/surgical history that increases complexity of ED encounter: None  Interpretation of Diagnostics Laboratory and/or imaging options to aid in the diagnosis/care of the patient were considered.  After careful history and physical examination, it was determined that there was no indication for diagnostics at this time (with the exception of bedside ultrasound).  Patient Reassessment and Ultimate Disposition/Management     Discharge  Patient management required discussion with the following services or consulting groups:  None  Complexity of Problems Addressed Acute illness or injury that poses threat of life of bodily function  Additional Data Reviewed and Analyzed Further history obtained from: Further history from spouse/family member  Additional Factors Impacting ED Encounter Risk Prescriptions and Minor Procedures  [name], [name]  Final Clinical Impressions(s) / ED Diagnoses     ICD-10-CM   1. Cellulitis, unspecified cellulitis site  L03.90       ED Discharge Orders          Ordered    naproxen (NAPROSYN) 500 MG tablet  2 times daily        08/05/22 0535    amoxicillin-clavulanate (AUGMENTIN) 875-125 MG tablet  Every 12 hours        08/05/22 0535             Discharge Instructions Discussed with and Provided to Patient:    Discharge Instructions      You were evaluated in the Emergency Department and after careful evaluation, we did not find any emergent condition requiring admission or further testing in the hospital.  Your exam/testing today was overall reassuring.  Symptoms seem to be due to a cellulitis or infection of the skin.  Take the Augmentin antibiotic as directed.  Use the Naprosyn anti-inflammatory for pain.  Please return to the Emergency Department if you experience any worsening of  your condition.  Thank you for allowing [place] to be a part of your care.       Maudie Flakes, MD 08/05/22 [phone]

## 2023-07-10 ENCOUNTER — Encounter (HOSPITAL_COMMUNITY): Payer: Self-pay

## 2023-07-10 ENCOUNTER — Ambulatory Visit (HOSPITAL_COMMUNITY)
Admission: EM | Admit: 2023-07-10 | Discharge: 2023-07-10 | Disposition: A | Discharge status: Home / Self Care | Payer: Medicaid Other | Attending: Emergency Medicine | Admitting: Emergency Medicine

## 2023-07-10 DIAGNOSIS — H60391 Other infective otitis externa, right ear: Secondary | ICD-10-CM

## 2023-07-10 MED ORDER — AMOXICILLIN-POT CLAVULANATE 875-125 MG PO TABS: 1.0000 | ORAL_TABLET | Freq: Two times a day (BID) | ORAL | 0 refills | Status: DC

## 2023-07-10 MED ORDER — OFLOXACIN 0.3 % OT SOLN: 10.0000 [drp] | Freq: Every day | OTIC | 0 refills | Status: AC

## 2023-07-10 NOTE — Discharge Instructions (Signed)
Take oral antibiotics as prescribed and with food.  Use the eardrops daily and do not submerge your ear in water until healed.  For pain relief you can do 800 mg of ibuprofen every 8 hours as needed.   Return to clinic if no improvement despite finishing antibiotics, or any new or urgent symptoms.

## 2023-07-10 NOTE — ED Triage Notes (Signed)
Pt is here for right ear pain and pressure x 3 days.

## 2023-07-10 NOTE — ED Provider Notes (Signed)
[facility]    CSN: [id number] Arrival date & time: 07/10/23  1654      History   Chief Complaint Chief Complaint  Patient presents with   Ear Pain    HPI Mark Taylor is a 22 y.o. male.   Patient presents to clinic for right ear pain and pressure for the past 3 days.  The pain and pressure started on the same day that he returned from the beach.  He has not had any drainage from the ears.  Denies any nasal congestion, sore throat or fevers.  Does not clean his ears with q-tips. Has not tried any interventions.      The history is provided by the patient and medical records.    Past Medical History:  Diagnosis Date   Legally blind in left eye, as defined in [place]     There are no problems to display for this patient.   Past Surgical History:  Procedure Laterality Date   EYE SURGERY Left        Home Medications    Prior to Admission medications   Medication Sig Start Date End Date Taking? Authorizing Provider  amoxicillin-clavulanate (AUGMENTIN) 875-125 MG tablet Take 1 tablet by mouth every 12 (twelve) hours. 07/10/23  Yes Rinaldo Ratel, [place] N, FNP  ofloxacin (FLOXIN) 0.3 % OTIC solution Place 10 drops into the right ear daily for 7 days. 07/10/23 07/17/23 Yes Sayaka Hoeppner, [place] N, FNP    Family History Family History  Problem Relation Age of Onset   Healthy Mother    Healthy Father     Social History Social History   Tobacco Use   Smoking status: Never   Smokeless tobacco: Never  Vaping Use   Vaping status: Never Used  Substance Use Topics   Alcohol use: Not Currently   Drug use: Yes    Types: Marijuana    Comment: socially     Allergies   Patient has no known allergies.   Review of Systems Review of Systems  Constitutional:  Negative for fever.  HENT:  Positive for ear pain. Negative for congestion, ear discharge and sore throat.      Physical Exam Triage Vital Signs ED Triage Vitals  Encounter Vitals Group     BP  07/10/23 1750 120/78     Systolic BP Percentile --      Diastolic BP Percentile --      Pulse Rate 07/10/23 1750 97     Resp 07/10/23 1750 16     Temp 07/10/23 1750 98.8 F (37.1 C)     Temp Source 07/10/23 1750 Oral     SpO2 07/10/23 1750 98 %     Weight --      Height --      Head Circumference --      Peak Flow --      Pain Score 07/10/23 1754 4     Pain Loc --      Pain Education --      Exclude from Growth Chart --    No data found.  Updated Vital Signs BP 120/78 (BP Location: Left Arm)   Pulse 97   Temp 98.8 F (37.1 C) (Oral)   Resp 16   SpO2 98%   Visual Acuity Right Eye Distance:   Left Eye Distance:   Bilateral Distance:    Right Eye Near:   Left Eye Near:    Bilateral Near:     Physical Exam Vitals and nursing note reviewed.  Constitutional:      Appearance: Normal appearance.  HENT:     Head: Normocephalic and atraumatic.     Right Ear: Drainage, swelling and tenderness present.     Left Ear: Tympanic membrane, ear canal and external ear normal.     Ears:     Comments: Right external auditory canal with extensive swelling and drainage.  There is tenderness to the canal.  Unable to visualize the tympanic membrane due to swelling.    Nose: Nose normal.     Mouth/Throat:     Mouth: Mucous membranes are moist.  Eyes:     Conjunctiva/sclera: Conjunctivae normal.  Cardiovascular:     Rate and Rhythm: Normal rate.  Pulmonary:     Effort: Pulmonary effort is normal. No respiratory distress.  Musculoskeletal:        General: Normal range of motion.  Skin:    General: Skin is warm and dry.  Neurological:     General: No focal deficit present.     Mental Status: He is alert and oriented to person, place, and time.  Psychiatric:        Mood and Affect: Mood normal.        Behavior: Behavior normal. Behavior is cooperative.      UC Treatments / Results  Labs (all labs ordered are listed, but only abnormal results are displayed) Labs Reviewed -  No data to display  EKG   Radiology No results found.  Procedures Procedures (including critical care time)  Medications Ordered in UC Medications - No data to display  Initial Impression / Assessment and Plan / UC Course  I have reviewed the triage vital signs and the nursing notes.  Pertinent labs & imaging results that were available during my care of the patient were reviewed by me and considered in my medical decision making (see chart for details).  Vitals and triage reviewed, patient is hemodynamically stable.  External auditory canal with significant swelling and drainage.  Unable to visualize tympanic membrane due to the swelling.  Will cover with ofloxacin and oral Augmentin for otitis externa with coverage for otitis media.  Symptomatic management of pain discussed.  Plan of care, follow-up care return precautions given, no questions at this time.     Final Clinical Impressions(s) / UC Diagnoses   Final diagnoses:  Infective otitis externa of right ear     Discharge Instructions      Take oral antibiotics as prescribed and with food.  Use the eardrops daily and do not submerge your ear in water until healed.  For pain relief you can do 800 mg of ibuprofen every 8 hours as needed.   Return to clinic if no improvement despite finishing antibiotics, or any new or urgent symptoms.     ED Prescriptions     Medication Sig Dispense Auth. Provider   ofloxacin (FLOXIN) 0.3 % OTIC solution Place 10 drops into the right ear daily for 7 days. 5 mL Rinaldo Ratel, [place] N, FNP   amoxicillin-clavulanate (AUGMENTIN) 875-125 MG tablet Take 1 tablet by mouth every 12 (twelve) hours. 14 tablet Nikesha Kwasny, [place] N, [place]      PDMP not reviewed this encounter.   Payden Docter, [place] N, [place] 07/10/23 [phone]

## 2023-12-12 ENCOUNTER — Ambulatory Visit (HOSPITAL_COMMUNITY)
Admission: EM | Admit: 2023-12-12 | Discharge: 2023-12-12 | Disposition: A | Discharge status: Home / Self Care | Attending: Family Medicine | Admitting: Family Medicine

## 2023-12-12 ENCOUNTER — Encounter (HOSPITAL_COMMUNITY): Payer: Self-pay

## 2023-12-12 DIAGNOSIS — S39012A Strain of muscle, fascia and tendon of lower back, initial encounter: Secondary | ICD-10-CM

## 2023-12-12 MED ORDER — CYCLOBENZAPRINE HCL 5 MG PO TABS: 5.0000 mg | ORAL_TABLET | Freq: Two times a day (BID) | ORAL | 0 refills | Status: AC | PRN

## 2023-12-12 MED ORDER — NAPROXEN 375 MG PO TABS: 375.0000 mg | ORAL_TABLET | Freq: Two times a day (BID) | ORAL | 0 refills | Status: AC
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
## 2023-12-12 NOTE — Discharge Instructions (Addendum)
 I prescribed anti-inflammatories, naproxen which should take twice daily for the next 3 to 5 days as needed to help with low back pain. Acute pain cyclobenzaprine 5 mg to be taken up to twice daily as needed for pain.  Avoid taking if working or driving a motor vehicle as this medication can cause severe drowsiness
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
